# Patient Record
Sex: Female | Born: 2000 | Race: White | Hispanic: No | Marital: Single | Smoking: Never smoker
Health system: Southern US, Community
[De-identification: ages and names within clinical notes are randomized; demographics above are authoritative.]

## PROBLEM LIST (undated history)

## (undated) DIAGNOSIS — F419 Anxiety disorder, unspecified: Secondary | ICD-10-CM

## (undated) HISTORY — DX: Anxiety disorder, unspecified: F41.9

## (undated) HISTORY — PX: NO PAST SURGERIES: SHX2092

---
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
## 2021-01-05 ENCOUNTER — Other Ambulatory Visit (HOSPITAL_COMMUNITY): Payer: Self-pay

## 2021-01-05 DIAGNOSIS — B009 Herpesviral infection, unspecified: Secondary | ICD-10-CM | POA: Diagnosis not present

## 2021-01-05 MED ORDER — VALACYCLOVIR HCL 500 MG PO TABS
ORAL_TABLET | ORAL | 11 refills | Status: DC
  Filled 2021-01-05: qty 30, 30d supply, fill #0
  Filled 2021-11-13: qty 30, 30d supply, fill #1

## 2021-01-09 ENCOUNTER — Other Ambulatory Visit (HOSPITAL_COMMUNITY): Payer: Self-pay

## 2021-01-09 MED FILL — Venlafaxine HCl Cap ER 24HR 75 MG (Base Equivalent): ORAL | 30 days supply | Qty: 60 | Fill #1 | Status: AC

## 2021-01-29 ENCOUNTER — Other Ambulatory Visit (HOSPITAL_COMMUNITY): Payer: Self-pay

## 2021-01-29 MED ORDER — CARESTART COVID-19 HOME TEST VI KIT
PACK | 0 refills | Status: AC
  Filled 2021-01-29: qty 4, 4d supply, fill #0

## 2021-02-05 ENCOUNTER — Other Ambulatory Visit (HOSPITAL_COMMUNITY): Payer: Self-pay

## 2021-02-05 MED FILL — Venlafaxine HCl Cap ER 24HR 75 MG (Base Equivalent): ORAL | 30 days supply | Qty: 60 | Fill #2 | Status: AC

## 2021-02-25 ENCOUNTER — Other Ambulatory Visit: Payer: Self-pay

## 2021-02-25 ENCOUNTER — Other Ambulatory Visit (INDEPENDENT_AMBULATORY_CARE_PROVIDER_SITE_OTHER): Payer: 59

## 2021-02-25 DIAGNOSIS — R7989 Other specified abnormal findings of blood chemistry: Secondary | ICD-10-CM | POA: Diagnosis not present

## 2021-02-25 LAB — TSH: TSH: 1.39 u[IU]/mL (ref 0.35–5.50)

## 2021-02-26 LAB — PROLACTIN: Prolactin: 15.7 ng/mL

## 2021-03-03 LAB — MACROPROLACTIN
Monomeric Prolactin (ICMA): 19 ng/mL
Percent Macroprolactin: 52 %
Prolactin, Serum (ICMA): 39.7 ng/mL — ABNORMAL HIGH

## 2021-03-10 ENCOUNTER — Other Ambulatory Visit (HOSPITAL_COMMUNITY): Payer: Self-pay

## 2021-03-10 MED FILL — Venlafaxine HCl Cap ER 24HR 75 MG (Base Equivalent): ORAL | 30 days supply | Qty: 60 | Fill #3 | Status: AC

## 2021-04-09 ENCOUNTER — Other Ambulatory Visit (HOSPITAL_COMMUNITY): Payer: Self-pay

## 2021-04-09 MED FILL — Venlafaxine HCl Cap ER 24HR 75 MG (Base Equivalent): ORAL | 30 days supply | Qty: 60 | Fill #4 | Status: AC

## 2021-05-10 ENCOUNTER — Other Ambulatory Visit (HOSPITAL_COMMUNITY): Payer: Self-pay

## 2021-05-10 MED FILL — Venlafaxine HCl Cap ER 24HR 75 MG (Base Equivalent): ORAL | 30 days supply | Qty: 60 | Fill #5 | Status: AC

## 2021-06-07 ENCOUNTER — Other Ambulatory Visit (HOSPITAL_COMMUNITY): Payer: Self-pay

## 2021-06-07 MED FILL — Venlafaxine HCl Cap ER 24HR 75 MG (Base Equivalent): ORAL | 30 days supply | Qty: 60 | Fill #6 | Status: AC

## 2021-07-08 ENCOUNTER — Other Ambulatory Visit (HOSPITAL_COMMUNITY): Payer: Self-pay

## 2021-07-08 MED FILL — Venlafaxine HCl Cap ER 24HR 75 MG (Base Equivalent): ORAL | 30 days supply | Qty: 60 | Fill #7 | Status: AC

## 2021-08-07 ENCOUNTER — Other Ambulatory Visit (HOSPITAL_COMMUNITY): Payer: Self-pay

## 2021-08-07 MED ORDER — CARESTART COVID-19 HOME TEST VI KIT
PACK | 0 refills | Status: AC
  Filled 2021-08-07: qty 4, 30d supply, fill #0

## 2021-08-11 ENCOUNTER — Other Ambulatory Visit (HOSPITAL_COMMUNITY): Payer: Self-pay

## 2021-08-11 MED FILL — Venlafaxine HCl Cap ER 24HR 75 MG (Base Equivalent): ORAL | 30 days supply | Qty: 60 | Fill #8 | Status: AC

## 2021-09-10 ENCOUNTER — Other Ambulatory Visit (HOSPITAL_COMMUNITY): Payer: Self-pay

## 2021-09-10 MED FILL — Venlafaxine HCl Cap ER 24HR 75 MG (Base Equivalent): ORAL | 30 days supply | Qty: 60 | Fill #9 | Status: AC

## 2021-10-01 ENCOUNTER — Other Ambulatory Visit (HOSPITAL_COMMUNITY): Payer: Self-pay

## 2021-10-01 MED ORDER — ALPRAZOLAM 0.5 MG PO TABS
ORAL_TABLET | ORAL | 1 refills | Status: AC
  Filled 2021-10-01: qty 30, 15d supply, fill #0

## 2021-10-01 MED ORDER — FLUOXETINE HCL 10 MG PO TABS
ORAL_TABLET | ORAL | 2 refills | Status: DC
  Filled 2021-10-01: qty 30, 30d supply, fill #0

## 2021-10-02 ENCOUNTER — Other Ambulatory Visit (HOSPITAL_COMMUNITY): Payer: Self-pay

## 2021-10-02 MED ORDER — PAROXETINE HCL 10 MG PO TABS
10.0000 mg | ORAL_TABLET | Freq: Every day | ORAL | 2 refills | Status: AC
  Filled 2021-10-02: qty 30, 30d supply, fill #0

## 2021-10-04 ENCOUNTER — Other Ambulatory Visit (HOSPITAL_COMMUNITY): Payer: Self-pay

## 2021-10-08 ENCOUNTER — Other Ambulatory Visit (HOSPITAL_COMMUNITY): Payer: Self-pay

## 2021-10-11 ENCOUNTER — Other Ambulatory Visit (HOSPITAL_COMMUNITY): Payer: Self-pay

## 2021-10-14 ENCOUNTER — Other Ambulatory Visit (HOSPITAL_COMMUNITY): Payer: Self-pay

## 2021-10-15 ENCOUNTER — Other Ambulatory Visit (HOSPITAL_COMMUNITY): Payer: Self-pay

## 2021-10-15 MED ORDER — VENLAFAXINE HCL ER 75 MG PO CP24
ORAL_CAPSULE | Freq: Two times a day (BID) | ORAL | 5 refills | Status: AC
  Filled 2021-10-15: qty 60, 30d supply, fill #0
  Filled 2021-11-13: qty 60, 30d supply, fill #1
  Filled 2021-12-13: qty 60, 30d supply, fill #2
  Filled 2022-01-15: qty 60, 30d supply, fill #3
  Filled 2022-02-16: qty 60, 30d supply, fill #4

## 2021-11-13 ENCOUNTER — Other Ambulatory Visit (HOSPITAL_COMMUNITY): Payer: Self-pay

## 2021-11-28 DIAGNOSIS — Z20822 Contact with and (suspected) exposure to covid-19: Secondary | ICD-10-CM | POA: Diagnosis not present

## 2021-11-28 DIAGNOSIS — Z20828 Contact with and (suspected) exposure to other viral communicable diseases: Secondary | ICD-10-CM | POA: Diagnosis not present

## 2021-11-28 DIAGNOSIS — T7840XA Allergy, unspecified, initial encounter: Secondary | ICD-10-CM | POA: Diagnosis not present

## 2021-11-28 DIAGNOSIS — J029 Acute pharyngitis, unspecified: Secondary | ICD-10-CM | POA: Diagnosis not present

## 2021-11-28 DIAGNOSIS — Z03818 Encounter for observation for suspected exposure to other biological agents ruled out: Secondary | ICD-10-CM | POA: Diagnosis not present

## 2021-11-29 ENCOUNTER — Other Ambulatory Visit (HOSPITAL_COMMUNITY): Payer: Self-pay

## 2021-11-29 MED ORDER — AZITHROMYCIN 250 MG PO TABS
ORAL_TABLET | ORAL | 0 refills | Status: AC
  Filled 2021-11-29: qty 6, 5d supply, fill #0

## 2021-12-07 ENCOUNTER — Other Ambulatory Visit (HOSPITAL_COMMUNITY): Payer: Self-pay

## 2021-12-13 ENCOUNTER — Other Ambulatory Visit (HOSPITAL_COMMUNITY): Payer: Self-pay

## 2022-01-17 ENCOUNTER — Other Ambulatory Visit (HOSPITAL_COMMUNITY): Payer: Self-pay

## 2022-01-19 ENCOUNTER — Other Ambulatory Visit (HOSPITAL_COMMUNITY): Payer: Self-pay

## 2022-01-19 MED ORDER — LEVONORGESTREL-ETHINYL ESTRAD 90-20 MCG PO TABS
1.0000 | ORAL_TABLET | Freq: Every day | ORAL | 11 refills | Status: AC
  Filled 2022-01-19 – 2022-02-02 (×2): qty 28, 28d supply, fill #0

## 2022-01-20 ENCOUNTER — Other Ambulatory Visit (HOSPITAL_COMMUNITY): Payer: Self-pay

## 2022-01-28 ENCOUNTER — Other Ambulatory Visit (HOSPITAL_COMMUNITY): Payer: Self-pay

## 2022-02-02 ENCOUNTER — Other Ambulatory Visit (HOSPITAL_COMMUNITY): Payer: Self-pay

## 2022-02-16 ENCOUNTER — Other Ambulatory Visit (HOSPITAL_COMMUNITY): Payer: Self-pay

## 2022-02-28 ENCOUNTER — Other Ambulatory Visit (HOSPITAL_COMMUNITY): Payer: Self-pay

## 2022-07-08 ENCOUNTER — Other Ambulatory Visit (HOSPITAL_COMMUNITY): Payer: Self-pay

## 2022-07-11 ENCOUNTER — Other Ambulatory Visit (HOSPITAL_COMMUNITY): Payer: Self-pay | Admitting: Psychiatry

## 2022-07-11 ENCOUNTER — Other Ambulatory Visit (HOSPITAL_COMMUNITY): Payer: Self-pay

## 2022-07-14 ENCOUNTER — Other Ambulatory Visit (HOSPITAL_COMMUNITY): Payer: Self-pay | Admitting: Psychiatry

## 2022-07-14 ENCOUNTER — Other Ambulatory Visit (HOSPITAL_COMMUNITY): Payer: Self-pay

## 2022-07-19 ENCOUNTER — Other Ambulatory Visit (HOSPITAL_COMMUNITY): Payer: Self-pay

## 2022-07-19 MED ORDER — VENLAFAXINE HCL ER 75 MG PO CP24
75.0000 mg | ORAL_CAPSULE | Freq: Every day | ORAL | 3 refills | Status: AC
  Filled 2022-07-19: qty 90, 90d supply, fill #0

## 2022-07-19 MED ORDER — VENLAFAXINE HCL ER 75 MG PO CP24
75.0000 mg | ORAL_CAPSULE | Freq: Every day | ORAL | 3 refills | Status: AC
  Filled 2022-10-26: qty 90, 90d supply, fill #0
  Filled 2022-12-31 – 2023-02-09 (×2): qty 30, 30d supply, fill #0

## 2022-07-20 ENCOUNTER — Other Ambulatory Visit (HOSPITAL_COMMUNITY): Payer: Self-pay

## 2022-09-02 ENCOUNTER — Other Ambulatory Visit (HOSPITAL_COMMUNITY): Payer: Self-pay

## 2022-09-02 MED ORDER — VENLAFAXINE HCL ER 75 MG PO CP24
150.0000 mg | ORAL_CAPSULE | Freq: Every day | ORAL | 11 refills | Status: DC
  Filled 2022-09-02: qty 60, 30d supply, fill #0
  Filled 2022-09-30 (×2): qty 60, 30d supply, fill #1
  Filled 2022-10-31: qty 60, 30d supply, fill #2
  Filled 2022-11-29: qty 60, 30d supply, fill #3
  Filled 2022-12-29 – 2022-12-31 (×2): qty 60, 30d supply, fill #4
  Filled 2023-02-13: qty 60, 30d supply, fill #5
  Filled 2023-03-16: qty 60, 30d supply, fill #6
  Filled 2023-04-13: qty 60, 30d supply, fill #7
  Filled 2023-05-11 – 2023-06-01 (×3): qty 60, 30d supply, fill #8
  Filled 2023-06-26: qty 60, 30d supply, fill #9
  Filled 2023-07-29: qty 60, 30d supply, fill #10

## 2022-09-03 ENCOUNTER — Other Ambulatory Visit (HOSPITAL_COMMUNITY): Payer: Self-pay

## 2022-09-30 ENCOUNTER — Encounter (HOSPITAL_COMMUNITY): Payer: Self-pay | Admitting: Pharmacist

## 2022-09-30 ENCOUNTER — Other Ambulatory Visit: Payer: Self-pay

## 2022-09-30 ENCOUNTER — Other Ambulatory Visit (HOSPITAL_COMMUNITY): Payer: Self-pay

## 2022-10-26 ENCOUNTER — Other Ambulatory Visit (HOSPITAL_COMMUNITY): Payer: Self-pay

## 2022-10-31 ENCOUNTER — Other Ambulatory Visit (HOSPITAL_COMMUNITY): Payer: Self-pay

## 2022-11-29 ENCOUNTER — Other Ambulatory Visit (HOSPITAL_COMMUNITY): Payer: Self-pay

## 2022-12-29 ENCOUNTER — Other Ambulatory Visit (HOSPITAL_COMMUNITY): Payer: Self-pay

## 2022-12-29 ENCOUNTER — Other Ambulatory Visit: Payer: Self-pay

## 2022-12-29 ENCOUNTER — Encounter: Payer: Self-pay | Admitting: Pharmacist

## 2022-12-31 ENCOUNTER — Other Ambulatory Visit (HOSPITAL_COMMUNITY): Payer: Self-pay

## 2023-01-02 ENCOUNTER — Other Ambulatory Visit: Payer: Self-pay

## 2023-01-16 ENCOUNTER — Other Ambulatory Visit (HOSPITAL_COMMUNITY): Payer: Self-pay

## 2023-02-09 ENCOUNTER — Other Ambulatory Visit (HOSPITAL_COMMUNITY): Payer: Self-pay

## 2023-02-10 ENCOUNTER — Other Ambulatory Visit (HOSPITAL_COMMUNITY): Payer: Self-pay

## 2023-02-11 ENCOUNTER — Other Ambulatory Visit (HOSPITAL_COMMUNITY): Payer: Self-pay

## 2023-02-13 ENCOUNTER — Other Ambulatory Visit (HOSPITAL_COMMUNITY): Payer: Self-pay

## 2023-02-13 ENCOUNTER — Encounter (HOSPITAL_COMMUNITY): Payer: Self-pay

## 2023-02-13 MED ORDER — VALACYCLOVIR HCL 500 MG PO TABS
500.0000 mg | ORAL_TABLET | Freq: Every day | ORAL | 11 refills | Status: AC
  Filled 2023-02-13: qty 30, 30d supply, fill #0
  Filled 2023-08-28 (×2): qty 30, 30d supply, fill #1
  Filled 2024-01-09 (×2): qty 30, 30d supply, fill #2

## 2023-02-14 ENCOUNTER — Other Ambulatory Visit (HOSPITAL_COMMUNITY): Payer: Self-pay

## 2023-02-16 ENCOUNTER — Other Ambulatory Visit (HOSPITAL_COMMUNITY): Payer: Self-pay

## 2023-03-16 ENCOUNTER — Other Ambulatory Visit (HOSPITAL_COMMUNITY): Payer: Self-pay

## 2023-04-07 ENCOUNTER — Other Ambulatory Visit (HOSPITAL_COMMUNITY): Payer: Self-pay

## 2023-04-07 MED ORDER — FLUTICASONE PROPIONATE 50 MCG/ACT NA SUSP
1.0000 | Freq: Two times a day (BID) | NASAL | 5 refills | Status: DC
  Filled 2023-04-07: qty 16, 30d supply, fill #0

## 2023-04-11 ENCOUNTER — Other Ambulatory Visit (HOSPITAL_COMMUNITY): Payer: Self-pay

## 2023-04-11 MED ORDER — TRETINOIN 0.025 % EX CREA
1.0000 | TOPICAL_CREAM | Freq: Every day | CUTANEOUS | 2 refills | Status: AC
  Filled 2023-04-11: qty 45, 30d supply, fill #0

## 2023-04-13 ENCOUNTER — Other Ambulatory Visit (HOSPITAL_COMMUNITY): Payer: Self-pay

## 2023-05-11 ENCOUNTER — Encounter: Payer: Self-pay | Admitting: Pharmacist

## 2023-05-11 ENCOUNTER — Other Ambulatory Visit (HOSPITAL_COMMUNITY): Payer: Self-pay

## 2023-05-11 ENCOUNTER — Other Ambulatory Visit: Payer: Self-pay

## 2023-05-16 ENCOUNTER — Other Ambulatory Visit: Payer: Self-pay

## 2023-06-01 ENCOUNTER — Other Ambulatory Visit (HOSPITAL_COMMUNITY): Payer: Self-pay

## 2023-06-26 ENCOUNTER — Other Ambulatory Visit (HOSPITAL_COMMUNITY): Payer: Self-pay

## 2023-06-27 ENCOUNTER — Other Ambulatory Visit (HOSPITAL_COMMUNITY): Payer: Self-pay

## 2023-07-29 ENCOUNTER — Other Ambulatory Visit (HOSPITAL_COMMUNITY): Payer: Self-pay

## 2023-07-31 ENCOUNTER — Other Ambulatory Visit (HOSPITAL_COMMUNITY): Payer: Self-pay

## 2023-08-19 ENCOUNTER — Telehealth: Payer: Commercial Managed Care - PPO | Admitting: Family Medicine

## 2023-08-19 ENCOUNTER — Other Ambulatory Visit (HOSPITAL_COMMUNITY): Payer: Self-pay

## 2023-08-19 DIAGNOSIS — J019 Acute sinusitis, unspecified: Secondary | ICD-10-CM

## 2023-08-19 MED ORDER — AMOXICILLIN-POT CLAVULANATE 875-125 MG PO TABS
1.0000 | ORAL_TABLET | Freq: Two times a day (BID) | ORAL | 0 refills | Status: AC
Start: 2023-08-19 — End: 2023-08-26
  Filled 2023-08-19 (×2): qty 14, 7d supply, fill #0

## 2023-08-19 MED ORDER — FLUTICASONE PROPIONATE 50 MCG/ACT NA SUSP
1.0000 | Freq: Two times a day (BID) | NASAL | 0 refills | Status: AC
Start: 2023-08-19 — End: 2023-09-18
  Filled 2023-08-19 (×2): qty 16, 30d supply, fill #0

## 2023-08-19 NOTE — Progress Notes (Signed)

## 2023-08-28 ENCOUNTER — Other Ambulatory Visit (HOSPITAL_BASED_OUTPATIENT_CLINIC_OR_DEPARTMENT_OTHER): Payer: Self-pay

## 2023-08-28 ENCOUNTER — Other Ambulatory Visit (HOSPITAL_COMMUNITY): Payer: Self-pay

## 2023-08-28 MED ORDER — VENLAFAXINE HCL ER 75 MG PO CP24
ORAL_CAPSULE | ORAL | 1 refills | Status: DC
  Filled 2023-08-28 (×2): qty 60, 30d supply, fill #0
  Filled 2023-11-11 (×3): qty 60, 30d supply, fill #1

## 2023-11-11 ENCOUNTER — Other Ambulatory Visit (HOSPITAL_COMMUNITY): Payer: Self-pay

## 2024-01-09 ENCOUNTER — Other Ambulatory Visit (HOSPITAL_COMMUNITY): Payer: Self-pay

## 2024-01-09 ENCOUNTER — Other Ambulatory Visit: Payer: Self-pay

## 2024-01-09 MED ORDER — VENLAFAXINE HCL ER 75 MG PO CP24
150.0000 mg | ORAL_CAPSULE | Freq: Every day | ORAL | 1 refills | Status: AC
  Filled 2024-01-09: qty 174, 87d supply, fill #0
  Filled 2024-01-09: qty 6, 3d supply, fill #0
  Filled 2024-01-09: qty 60, 30d supply, fill #0

## 2024-01-10 ENCOUNTER — Other Ambulatory Visit (HOSPITAL_COMMUNITY): Payer: Self-pay

## 2024-01-10 ENCOUNTER — Other Ambulatory Visit: Payer: Self-pay

## 2024-01-10 MED ORDER — ALPRAZOLAM 0.5 MG PO TABS
0.5000 mg | ORAL_TABLET | Freq: Four times a day (QID) | ORAL | 0 refills | Status: AC | PRN
  Filled 2024-01-10: qty 30, 8d supply, fill #0

## 2024-01-10 MED ORDER — VALACYCLOVIR HCL 500 MG PO TABS
500.0000 mg | ORAL_TABLET | Freq: Every day | ORAL | 3 refills | Status: AC
  Filled 2024-01-10: qty 30, 30d supply, fill #0
  Filled 2024-08-12: qty 90, 90d supply, fill #0
  Filled 2024-08-12: qty 30, 30d supply, fill #0

## 2024-01-10 MED ORDER — VENLAFAXINE HCL ER 75 MG PO CP24
150.0000 mg | ORAL_CAPSULE | Freq: Every day | ORAL | 1 refills | Status: AC
  Filled 2024-01-10: qty 60, 30d supply, fill #0
  Filled 2024-08-12: qty 180, 90d supply, fill #1

## 2024-01-11 ENCOUNTER — Other Ambulatory Visit: Payer: Self-pay

## 2024-01-13 ENCOUNTER — Other Ambulatory Visit (HOSPITAL_COMMUNITY): Payer: Self-pay

## 2024-01-15 ENCOUNTER — Other Ambulatory Visit (HOSPITAL_COMMUNITY): Payer: Self-pay

## 2024-08-12 ENCOUNTER — Other Ambulatory Visit (HOSPITAL_COMMUNITY): Payer: Self-pay

## 2024-08-12 ENCOUNTER — Other Ambulatory Visit: Payer: Self-pay
